# Patient Record
Sex: Male | Born: 1985 | Race: White | Hispanic: No | Marital: Single
Health system: Southern US, Community
[De-identification: ages and names within clinical notes are randomized; demographics above are authoritative.]

---
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
## 2008-10-18 ENCOUNTER — Ambulatory Visit: Payer: Self-pay | Admitting: Gastroenterology

## 2008-11-22 ENCOUNTER — Ambulatory Visit: Payer: Self-pay | Admitting: Unknown Physician Specialty

## 2008-11-28 ENCOUNTER — Ambulatory Visit: Payer: Self-pay | Admitting: Unknown Physician Specialty

## 2010-08-27 IMAGING — US ABDOMEN ULTRASOUND
1 series · 17 of 25 positions shown · non-contrast
Comparison: none

REASON FOR EXAM: Abd Pain Generalized Eval Stones
COMMENTS:

[Series 1: abdomen ultrasound · 17 of 50 slices shown]
[im 1/50]
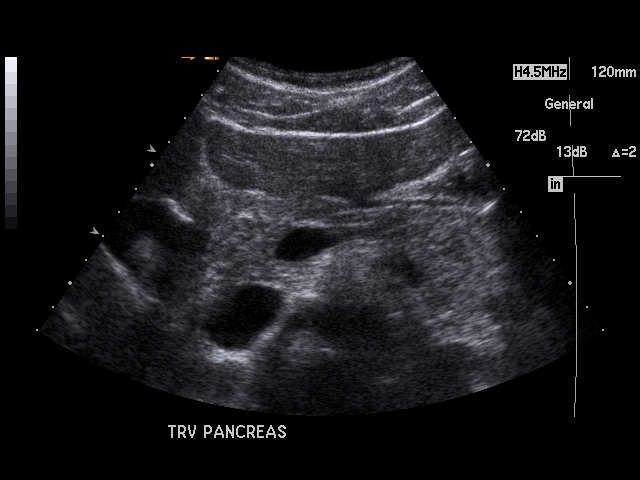
[im 5/50]
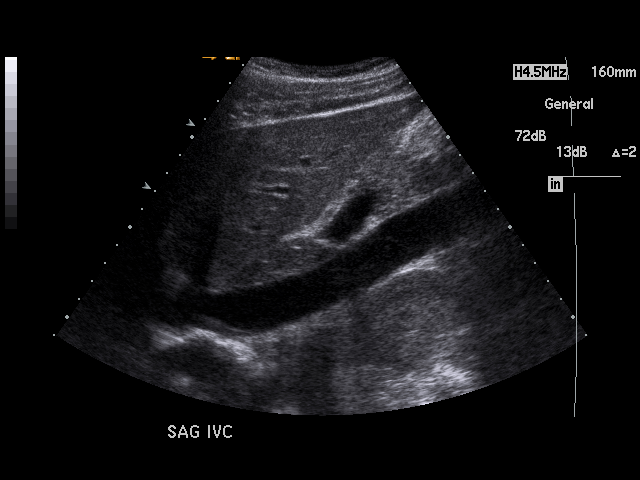
[im 7/50]
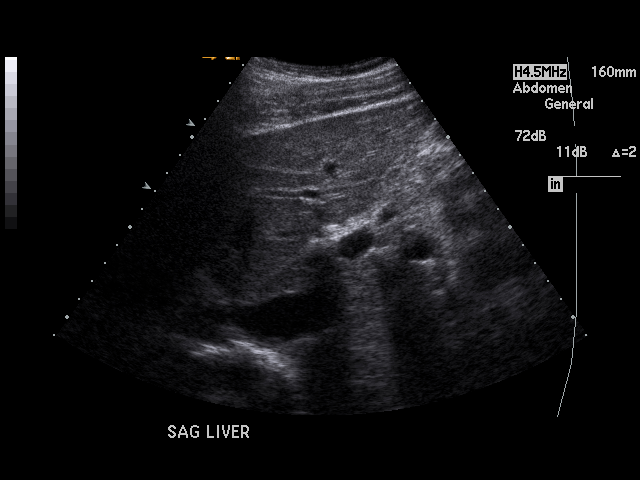
[im 11/50]
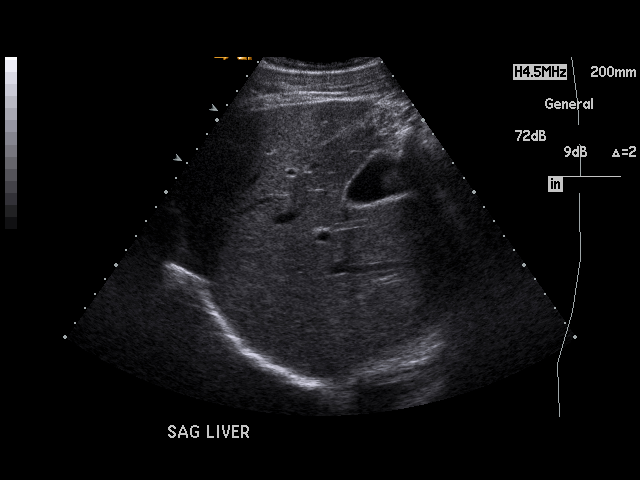
[im 13/50]
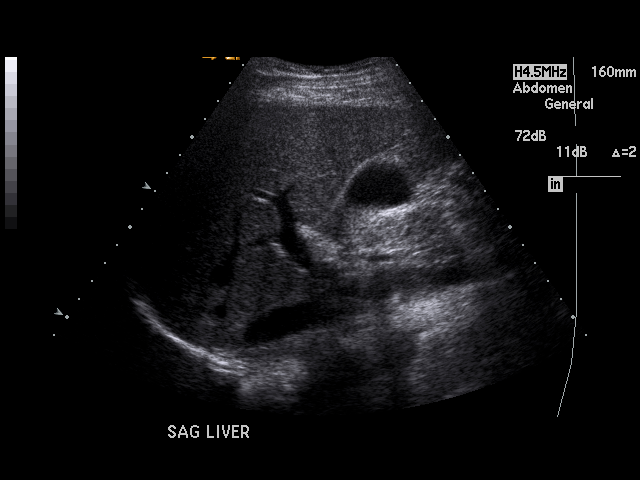
[im 17/50]
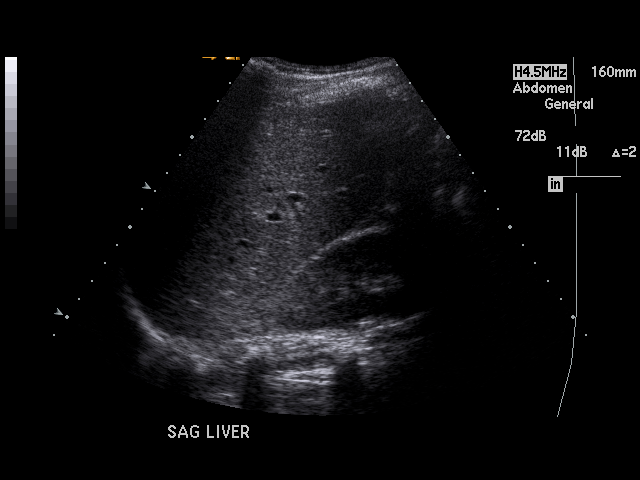
[im 19/50]
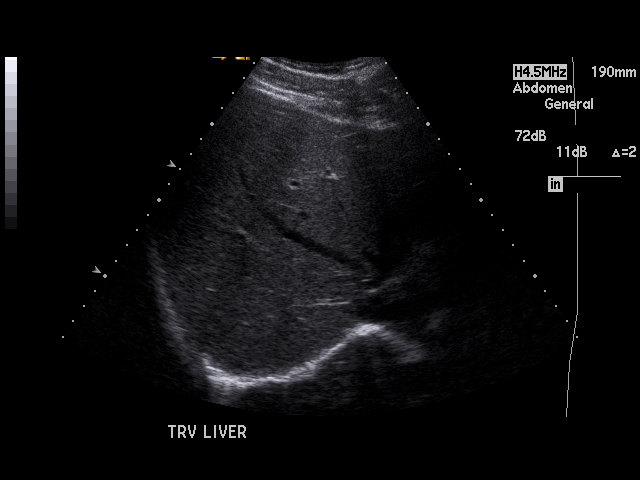
[im 23/50]
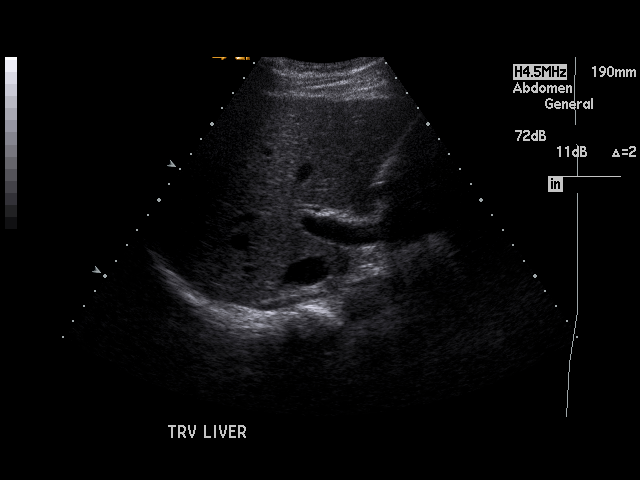
[im 25/50]
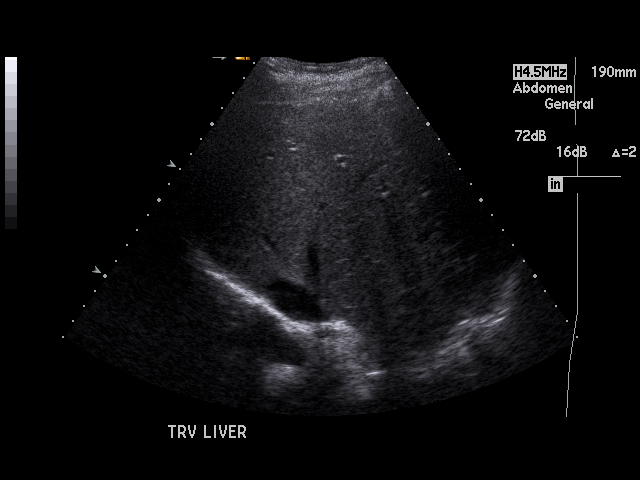
[im 27/50]
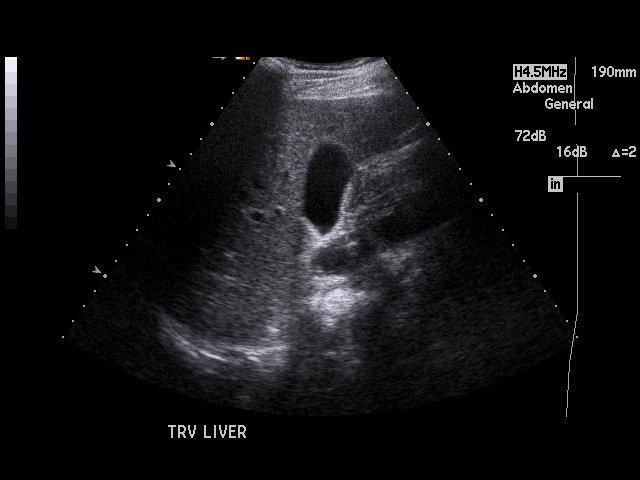
[im 31/50]
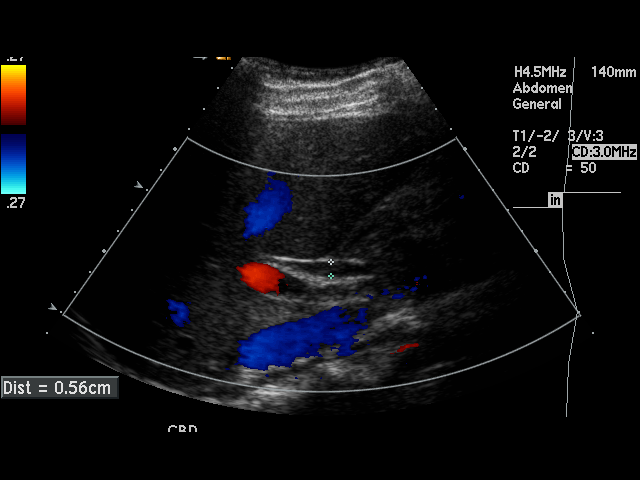
[im 33/50]
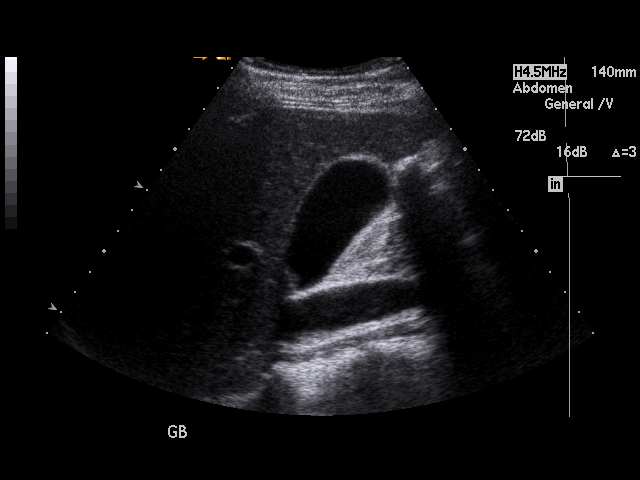
[im 37/50]
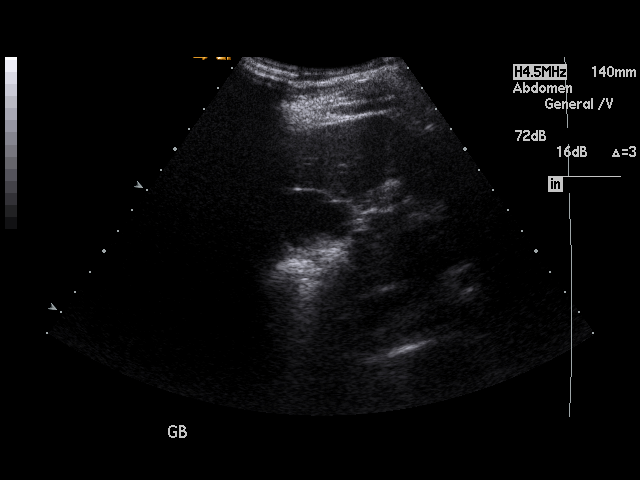
[im 39/50]
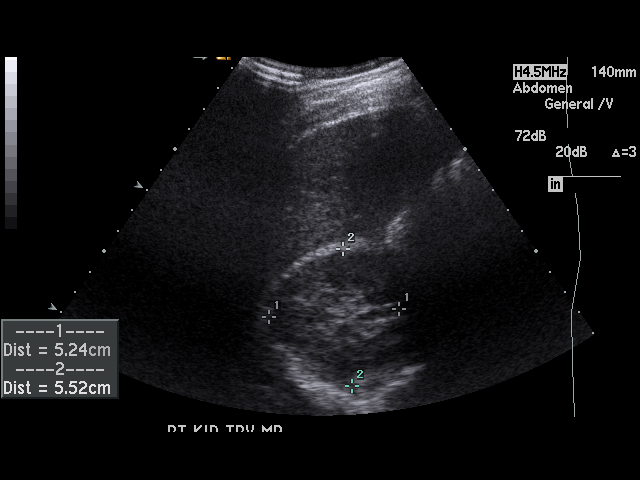
[im 43/50]
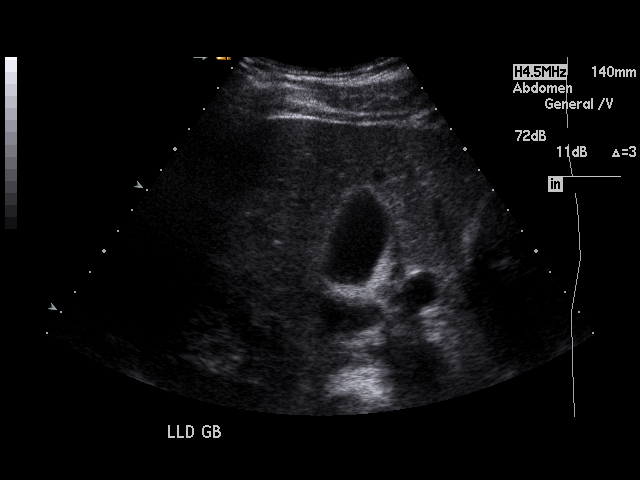
[im 45/50]
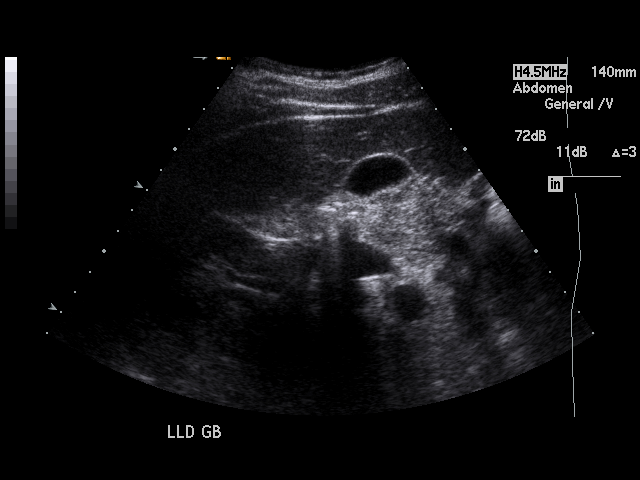
[im 50/50]
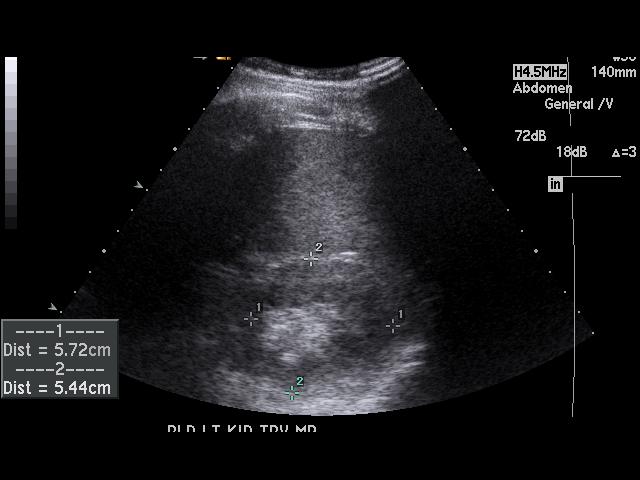

[17 of 25 positions shown; findings below may reference images not displayed]

PROCEDURE:     US  - US ABDOMEN GENERAL SURVEY  - November 22, 2008  [DATE]

RESULT:     Sonographic evaluation of the abdomen demonstrates the
visualized pancreas, liver, gallbladder, kidneys and aorta appear to be
unremarkable. The spleen appears enlarged measuring up to 14.26 cm in
length. The common bile duct diameter is also prominent at 5.6 mm. No
gallstones are evident. There is no sonographic Murphy's sign. There is no
ascites. The portal venous flow is unremarkable.
IMPRESSION: 1.  Common bile duct dilation. No definite gallstones are evident. Correlate
with LFT's.
2.  Incomplete visualization of the liver.
3.  Splenomegaly.

## 2010-08-27 IMAGING — NM NUCLEAR MEDICINE HEPATOHBILIARY INCLUDE GB
3 series · 20 of 20 positions shown · non-contrast
Comparison: none

REASON FOR EXAM: Abdominal Pain Generalized Eval Stones
COMMENTS:

[Series 1000: gallbladder dynamic (results) · 4.80mm/px · 6 of 60 frames shown]
[frame 6/60]
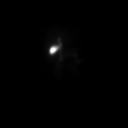
[frame 16/60]
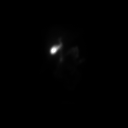
[frame 26/60]
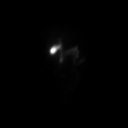
[frame 36/60]
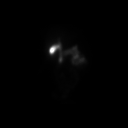
[frame 46/60]
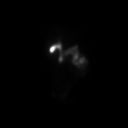
[frame 56/60]
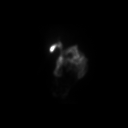

[Series 1000: gallbladder statics · 4.80mm/px · 8 of 8 slices shown]
[im 1/8]
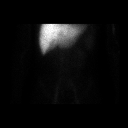
[im 2/8]
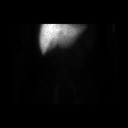
[im 3/8]
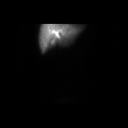
[im 4/8]
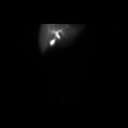
[im 5/8]
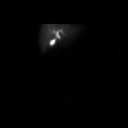
[im 6/8]
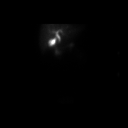
[im 7/8]
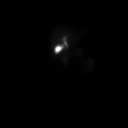
[im 8/8]
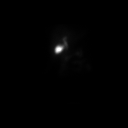

[Series 1000: gallbladder dynamic · 4.80mm/px · 6 of 60 frames shown]
[frame 6/60]
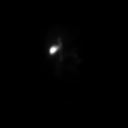
[frame 16/60]
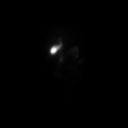
[frame 26/60]
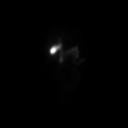
[frame 36/60]
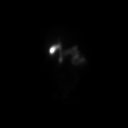
[frame 46/60]
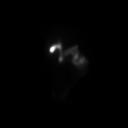
[frame 56/60]
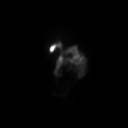

[20 of 20 positions shown; findings below may reference images not displayed]

PROCEDURE:     NM  - NM HEPATO WITH GB EJECT FRACTION  - November 22, 2008  [DATE]

RESULT:     The patient received an injection of 8.18 mCi of technetium 99m
Choletec. There is extraction of tracer from the blood pool by the liver
with activity first seen in the gallbladder and common bile duct by 10
minutes. There is small bowel localization by 30 minutes. There is
progressive clearing of activity from the liver parenchyma with accumulation
in the gallbladder. A continuous infusion of sincalide was initiated with a
total dose of 1.6 mcg administered over 30 minutes. The gallbladder ejection
fraction is calculated at 72%.
IMPRESSION: Normal appearing hepatobiliary scan and gallbladder
ejection fraction.

## 2018-02-12 ENCOUNTER — Emergency Department (HOSPITAL_COMMUNITY): Payer: Worker's Compensation

## 2018-02-12 ENCOUNTER — Inpatient Hospital Stay (HOSPITAL_COMMUNITY)
Admission: EM | Admit: 2018-02-12 | Discharge: 2018-02-17 | DRG: 087 | Disposition: A | Discharge status: Home Health | Payer: Worker's Compensation | Attending: Neurological Surgery | Admitting: Neurological Surgery

## 2018-02-12 DIAGNOSIS — S020XXA Fracture of vault of skull, initial encounter for closed fracture: Secondary | ICD-10-CM | POA: Diagnosis present

## 2018-02-12 DIAGNOSIS — S0101XA Laceration without foreign body of scalp, initial encounter: Secondary | ICD-10-CM | POA: Diagnosis present

## 2018-02-12 DIAGNOSIS — Y9289 Other specified places as the place of occurrence of the external cause: Secondary | ICD-10-CM

## 2018-02-12 DIAGNOSIS — S0990XA Unspecified injury of head, initial encounter: Secondary | ICD-10-CM

## 2018-02-12 DIAGNOSIS — N3289 Other specified disorders of bladder: Secondary | ICD-10-CM | POA: Diagnosis present

## 2018-02-12 DIAGNOSIS — R400 Somnolence: Secondary | ICD-10-CM | POA: Diagnosis present

## 2018-02-12 DIAGNOSIS — G9389 Other specified disorders of brain: Secondary | ICD-10-CM | POA: Diagnosis present

## 2018-02-12 DIAGNOSIS — S065X9A Traumatic subdural hemorrhage with loss of consciousness of unspecified duration, initial encounter: Secondary | ICD-10-CM | POA: Diagnosis present

## 2018-02-12 DIAGNOSIS — R339 Retention of urine, unspecified: Secondary | ICD-10-CM | POA: Diagnosis present

## 2018-02-12 DIAGNOSIS — R001 Bradycardia, unspecified: Secondary | ICD-10-CM | POA: Diagnosis present

## 2018-02-12 DIAGNOSIS — S065X0A Traumatic subdural hemorrhage without loss of consciousness, initial encounter: Principal | ICD-10-CM | POA: Diagnosis present

## 2018-02-12 DIAGNOSIS — S065XAA Traumatic subdural hemorrhage with loss of consciousness status unknown, initial encounter: Secondary | ICD-10-CM

## 2018-02-12 DIAGNOSIS — S50312A Abrasion of left elbow, initial encounter: Secondary | ICD-10-CM | POA: Diagnosis present

## 2018-02-12 DIAGNOSIS — S069X1A Unspecified intracranial injury with loss of consciousness of 30 minutes or less, initial encounter: Secondary | ICD-10-CM

## 2018-02-12 DIAGNOSIS — Z23 Encounter for immunization: Secondary | ICD-10-CM

## 2018-02-12 DIAGNOSIS — S069XAA Unspecified intracranial injury with loss of consciousness status unknown, initial encounter: Secondary | ICD-10-CM | POA: Diagnosis present

## 2018-02-12 DIAGNOSIS — S0219XA Other fracture of base of skull, initial encounter for closed fracture: Secondary | ICD-10-CM | POA: Diagnosis present

## 2018-02-12 DIAGNOSIS — S069X9A Unspecified intracranial injury with loss of consciousness of unspecified duration, initial encounter: Secondary | ICD-10-CM | POA: Diagnosis present

## 2018-02-12 LAB — COMPREHENSIVE METABOLIC PANEL
ALK PHOS: 60 U/L (ref 38–126)
ALT: 38 U/L (ref 0–44)
AST: 33 U/L (ref 15–41)
Albumin: 4.2 g/dL (ref 3.5–5.0)
Anion gap: 10 (ref 5–15)
BUN: 9 mg/dL (ref 6–20)
CALCIUM: 8.9 mg/dL (ref 8.9–10.3)
CHLORIDE: 107 mmol/L (ref 98–111)
CO2: 23 mmol/L (ref 22–32)
CREATININE: 0.93 mg/dL (ref 0.61–1.24)
Glucose, Bld: 140 mg/dL — ABNORMAL HIGH (ref 70–99)
Potassium: 3 mmol/L — ABNORMAL LOW (ref 3.5–5.1)
Sodium: 140 mmol/L (ref 135–145)
Total Bilirubin: 0.7 mg/dL (ref 0.3–1.2)
Total Protein: 6.9 g/dL (ref 6.5–8.1)

## 2018-02-12 LAB — CBC
HEMATOCRIT: 46.8 % (ref 39.0–52.0)
Hemoglobin: 15.5 g/dL (ref 13.0–17.0)
MCH: 28.9 pg (ref 26.0–34.0)
MCHC: 33.1 g/dL (ref 30.0–36.0)
MCV: 87.3 fL (ref 80.0–100.0)
NRBC: 0 % (ref 0.0–0.2)
PLATELETS: 302 10*3/uL (ref 150–400)
RBC: 5.36 MIL/uL (ref 4.22–5.81)
RDW: 12.2 % (ref 11.5–15.5)
WBC: 14.4 10*3/uL — AB (ref 4.0–10.5)

## 2018-02-12 LAB — SAMPLE TO BLOOD BANK

## 2018-02-12 LAB — URINALYSIS, ROUTINE W REFLEX MICROSCOPIC
BILIRUBIN URINE: NEGATIVE
Glucose, UA: NEGATIVE mg/dL
HGB URINE DIPSTICK: NEGATIVE
KETONES UR: NEGATIVE mg/dL
Leukocytes, UA: NEGATIVE
NITRITE: NEGATIVE
PH: 5 (ref 5.0–8.0)
Protein, ur: NEGATIVE mg/dL
SPECIFIC GRAVITY, URINE: 1.039 — AB (ref 1.005–1.030)

## 2018-02-12 LAB — I-STAT CHEM 8, ED
BUN: 11 mg/dL (ref 6–20)
CHLORIDE: 104 mmol/L (ref 98–111)
CREATININE: 0.9 mg/dL (ref 0.61–1.24)
Calcium, Ion: 1.13 mmol/L — ABNORMAL LOW (ref 1.15–1.40)
GLUCOSE: 139 mg/dL — AB (ref 70–99)
HEMATOCRIT: 47 % (ref 39.0–52.0)
Hemoglobin: 16 g/dL (ref 13.0–17.0)
POTASSIUM: 2.9 mmol/L — AB (ref 3.5–5.1)
Sodium: 142 mmol/L (ref 135–145)
TCO2: 26 mmol/L (ref 22–32)

## 2018-02-12 LAB — PROTIME-INR
INR: 1.06
Prothrombin Time: 13.7 seconds (ref 11.4–15.2)

## 2018-02-12 LAB — ETHANOL: Alcohol, Ethyl (B): <10 mg/dL (ref <10)

## 2018-02-12 LAB — CDS SEROLOGY

## 2018-02-12 LAB — I-STAT CG4 LACTIC ACID, ED: Lactic Acid, Venous: 2.34 mmol/L (ref 0.5–1.9)

## 2018-02-12 LAB — LACTIC ACID, PLASMA: Lactic Acid, Venous: 2.3 mmol/L (ref 0.5–1.9)

## 2018-02-12 MED ORDER — ONDANSETRON HCL 4 MG/2ML IJ SOLN
4.0000 mg | Freq: Once | INTRAMUSCULAR | Status: AC
  Administered 2018-02-12: 4 mg via INTRAVENOUS
  Filled 2018-02-12: qty 2

## 2018-02-12 MED ORDER — OXYCODONE HCL 5 MG PO TABS
10.0000 mg | ORAL_TABLET | ORAL | Status: DC | PRN
  Administered 2018-02-12 – 2018-02-15 (×11): 10 mg via ORAL
  Filled 2018-02-12 (×11): qty 2

## 2018-02-12 MED ORDER — FENTANYL CITRATE (PF) 100 MCG/2ML IJ SOLN
50.0000 ug | Freq: Once | INTRAMUSCULAR | Status: AC
  Administered 2018-02-12: 50 ug via INTRAVENOUS
  Filled 2018-02-12: qty 2

## 2018-02-12 MED ORDER — TETANUS-DIPHTH-ACELL PERTUSSIS 5-2.5-18.5 LF-MCG/0.5 IM SUSP
0.5000 mL | Freq: Once | INTRAMUSCULAR | Status: AC
  Administered 2018-02-12: 0.5 mL via INTRAMUSCULAR
  Filled 2018-02-12: qty 0.5

## 2018-02-12 MED ORDER — HYDROMORPHONE HCL 1 MG/ML IJ SOLN
0.5000 mg | INTRAMUSCULAR | Status: DC | PRN
  Administered 2018-02-12 – 2018-02-16 (×13): 0.5 mg via INTRAVENOUS
  Filled 2018-02-12 (×13): qty 1

## 2018-02-12 MED ORDER — IOPAMIDOL (ISOVUE-300) INJECTION 61%
100.0000 mL | Freq: Once | INTRAVENOUS | Status: AC | PRN
  Administered 2018-02-12: 100 mL via INTRAVENOUS

## 2018-02-12 MED ORDER — ONDANSETRON HCL 4 MG/2ML IJ SOLN
INTRAMUSCULAR | Status: AC
  Filled 2018-02-12: qty 2

## 2018-02-12 NOTE — Progress Notes (Signed)
Per RN, pt's wife already notified.  Spoke w/ ED front desk and they will inform when she arrives.  Passing off to incoming chaplain w/ updates.  Margretta SidleAndrea M Linnet Bottari Chaplain resident, [phone]
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
## 2018-02-12 NOTE — ED Provider Notes (Signed)
..  Laceration Repair Date/Time: 02/12/2018 8:27 PM Performed by: Jeannie FendMurphy, Laura A, PA-C Authorized by: Jeannie FendMurphy, Laura A, PA-C   Consent:    Consent obtained:  Verbal   Consent given by:  Patient   Risks discussed:  Infection, need for additional repair, pain, poor cosmetic result and poor wound healing   Alternatives discussed:  No treatment and delayed treatment Universal protocol:    Procedure explained and questions answered to patient or proxy's satisfaction: yes     Relevant documents present and verified: yes     Test results available and properly labeled: yes     Imaging studies available: yes     Required blood products, implants, devices, and special equipment available: yes     Site/side marked: yes     Immediately prior to procedure, a time out was called: yes     Patient identity confirmed:  Verbally with patient Anesthesia (see MAR for exact dosages):    Anesthesia method:  None Laceration details:    Location:  Scalp   Scalp location:  L parietal   Length (cm):  3   Depth (mm):  3 Repair type:    Repair type:  Simple Pre-procedure details:    Preparation:  Patient was prepped and draped in usual sterile fashion and imaging obtained to evaluate for foreign bodies Exploration:    Wound exploration: entire depth of wound probed and visualized     Wound extent: no muscle damage noted     Contaminated: no   Treatment:    Area cleansed with:  Saline   Amount of cleaning:  Standard   Irrigation solution:  Sterile saline Skin repair:    Repair method:  Staples   Number of staples:  3 Approximation:    Approximation:  Close Post-procedure details:    Dressing:  Open (no dressing)   Patient tolerance of procedure:  Tolerated well, no immediate complications     Alden HippMurphy, Laura A, PA-C 02/12/18 2028    Vanetta MuldersZackowski, Scott, MD 02/16/18 [phone]

## 2018-02-13 LAB — LACTIC ACID, PLASMA
LACTIC ACID, VENOUS: 1.8 mmol/L (ref 0.5–1.9)
Lactic Acid, Venous: 1.5 mmol/L (ref 0.5–1.9)

## 2018-02-13 LAB — MRSA PCR SCREENING: MRSA BY PCR: NEGATIVE

## 2018-02-13 MED ORDER — LABETALOL HCL 5 MG/ML IV SOLN: 10.0000 mg | INTRAVENOUS | Status: DC | PRN

## 2018-02-13 MED ORDER — DOCUSATE SODIUM 100 MG PO CAPS
100.0000 mg | ORAL_CAPSULE | Freq: Two times a day (BID) | ORAL | Status: DC
  Administered 2018-02-13 – 2018-02-17 (×9): 100 mg via ORAL
  Filled 2018-02-13 (×9): qty 1

## 2018-02-13 MED ORDER — FAMOTIDINE 20 MG PO TABS
20.0000 mg | ORAL_TABLET | Freq: Every day | ORAL | Status: DC
  Administered 2018-02-13 – 2018-02-17 (×5): 20 mg via ORAL
  Filled 2018-02-13 (×5): qty 1

## 2018-02-13 MED ORDER — TAMSULOSIN HCL 0.4 MG PO CAPS
0.4000 mg | ORAL_CAPSULE | Freq: Every day | ORAL | Status: AC
  Administered 2018-02-13 – 2018-02-15 (×3): 0.4 mg via ORAL
  Filled 2018-02-13 (×3): qty 1

## 2018-02-13 MED ORDER — ONDANSETRON HCL 4 MG PO TABS
4.0000 mg | ORAL_TABLET | ORAL | Status: DC | PRN
  Administered 2018-02-16 – 2018-02-17 (×4): 4 mg via ORAL
  Filled 2018-02-13 (×4): qty 1

## 2018-02-13 MED ORDER — ACETAMINOPHEN 325 MG PO TABS
650.0000 mg | ORAL_TABLET | ORAL | Status: DC | PRN
  Administered 2018-02-13 – 2018-02-15 (×3): 650 mg via ORAL
  Filled 2018-02-13 (×3): qty 2

## 2018-02-13 MED ORDER — OXYCODONE HCL 5 MG PO TABS
5.0000 mg | ORAL_TABLET | ORAL | Status: DC | PRN
  Administered 2018-02-15: 5 mg via ORAL
  Filled 2018-02-13: qty 1

## 2018-02-13 MED ORDER — SODIUM CHLORIDE 0.9 % IV SOLN
INTRAVENOUS | Status: DC | PRN
  Administered 2018-02-13: 1000 mL via INTRAVENOUS

## 2018-02-13 MED ORDER — ONDANSETRON HCL 4 MG/2ML IJ SOLN
4.0000 mg | INTRAMUSCULAR | Status: DC | PRN
  Administered 2018-02-13 – 2018-02-16 (×7): 4 mg via INTRAVENOUS
  Filled 2018-02-13 (×7): qty 2

## 2018-02-13 MED ORDER — POLYETHYLENE GLYCOL 3350 17 G PO PACK: 17.0000 g | PACK | Freq: Every day | ORAL | Status: DC | PRN

## 2018-02-13 MED ORDER — ACETAMINOPHEN 650 MG RE SUPP: 650.0000 mg | RECTAL | Status: DC | PRN

## 2018-02-13 MED ORDER — PROMETHAZINE HCL 25 MG PO TABS
12.5000 mg | ORAL_TABLET | ORAL | Status: DC | PRN
  Administered 2018-02-13 (×2): 25 mg via ORAL
  Administered 2018-02-14 – 2018-02-16 (×2): 12.5 mg via ORAL
  Filled 2018-02-13 (×4): qty 1

## 2018-02-13 MED ORDER — HEPARIN SODIUM (PORCINE) 5000 UNIT/ML IJ SOLN
5000.0000 [IU] | Freq: Three times a day (TID) | INTRAMUSCULAR | Status: DC
  Administered 2018-02-14 – 2018-02-16 (×8): 5000 [IU] via SUBCUTANEOUS
  Filled 2018-02-13 (×9): qty 1

## 2018-02-13 MED ORDER — HYDROMORPHONE HCL 1 MG/ML IJ SOLN: 0.3000 mg | INTRAMUSCULAR | Status: DC | PRN
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
## 2018-02-14 MED ORDER — PROCHLORPERAZINE 25 MG RE SUPP
25.0000 mg | Freq: Once | RECTAL | Status: AC
  Administered 2018-02-14: 25 mg via RECTAL
  Filled 2018-02-14: qty 1

## 2018-02-14 MED ORDER — OXYCODONE HCL 5 MG PO TABS: 5.0000 mg | ORAL_TABLET | ORAL | 0 refills | Status: DC | PRN

## 2018-02-14 MED ORDER — PROCHLORPERAZINE 25 MG RE SUPP
25.0000 mg | Freq: Two times a day (BID) | RECTAL | Status: DC | PRN
  Filled 2018-02-14: qty 1

## 2018-02-14 MED ORDER — DOCUSATE SODIUM 100 MG PO CAPS: 100.0000 mg | ORAL_CAPSULE | Freq: Two times a day (BID) | ORAL | 0 refills | Status: DC

## 2018-02-14 MED ORDER — SODIUM CHLORIDE 0.9 % IV BOLUS
500.0000 mL | Freq: Once | INTRAVENOUS | Status: AC
  Administered 2018-02-14: 500 mL via INTRAVENOUS

## 2018-02-14 NOTE — Progress Notes (Signed)
Pt unable to void despite bladder scan volume of . Pt refuses to be in/out cath, states he will try to self void again.

## 2018-02-14 NOTE — Progress Notes (Signed)
Pt continues to be nauseous and have limited intake, Dr Franky Machoabbell notified. Orders for 25mg  of rectal compazine and 500 ml NS bolus.
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
## 2018-02-14 NOTE — Discharge Instructions (Addendum)
Discharge Instructions  No restriction in activities, slowly increase your activity back to normal. Okay to shower on the day of discharge. Do not perform any activities that put you at risk for a second head injury. Two severe head injuries in close succession can be very severe and sometimes fatal, which is called second impact syndrome.  Call your primary care physician to schedule a follow up appointment for 1-2 weeks from now to discuss your injury and hospitalization.  Call Dr. Marcy Sirenstergard's office at [phone] if you have any clear drainage from your ears or nose. This could be a sign of a cerebrospinal fluid leak, which can become serious if it is left untreated.    Concussion, Adult A concussion is a brain injury from a direct hit (blow) to the head or body. This injury causes the brain to shake quickly back and forth inside the skull. It is caused by:  A hit to the head.  A quick and sudden movement (jolt) of the head or neck.  How fast you will get better from a concussion depends on many things like how bad your concussion was, what part of your brain was hurt, how old you are, and how healthy you were before the concussion. Recovery can take time. It is important to wait to return to activity until a doctor says it is safe and your symptoms are all gone. Follow these instructions at home: Activity  Limit activities that need a lot of thought or concentration. These include: ? Homework or work for your job. ? Watching TV. ? Computer work. ? Playing memory games and puzzles.  Rest. Rest helps the brain to heal. Make sure you: ? Get plenty of sleep at night. Do not stay up late. ? Go to bed at the same time every day. ? Rest during the day. Take naps or rest breaks when you feel tired.  It can be dangerous if you get another concussion before the first one has healed Do not do activities that could cause a second concussion, such as riding a bike or playing sports.  Ask  your doctor when you can return to your normal activities, like driving, riding a bike, or using machinery. Your ability to react may be slower. Do not do these activities if you are dizzy. Your doctor will likely give you a plan for slowly going back to activities. General instructions  Take over-the-counter and prescription medicines only as told by your doctor.  Do not drink alcohol until your doctor says you can.  If it is harder than usual to remember things, write them down.  If you are easily distracted, try to do one thing at a time. For example, do not try to watch TV while making dinner.  Talk with family members or close friends when you need to make important decisions.  Watch your symptoms and tell other people to do the same. Other problems (complications) can happen after a concussion. Older adults with a brain injury may have a higher risk of serious problems, such as a blood clot in the brain.  Tell your teachers, school nurse, school counselor, coach, Event organiserathletic trainer, or work Production designer, theatre/television/filmmanager about your injury and symptoms. Tell them about what you can or cannot do. They should watch for: ? More problems with attention or concentration. ? More trouble remembering or learning new information. ? More time needed to do tasks or assignments. ? Being more annoyed (irritable) or having a harder time dealing with stress. ? Any  other symptoms that get worse.  Keep all follow-up visits as told by your health care provider. This is important. Prevention  It is very important that you donot get another brain injury, especially before you have healed. In rare cases, another injury can cause permanent brain damage, brain swelling, or death. You have the most risk if you get another head injury in the first 7-10 days after you were hurt before. To avoid injuries: ? Wear a seat belt when you ride in a car. ? Do not drink too much alcohol. ? Avoid activities that could make you get a second  concussion, like contact sports. ? Wear a helmet when you do activities like:  Biking.  Skiing.  Skateboarding.  Skating. ? Make your home safe by:  Removing things from the floor or stairs that could make you trip.  Using grab bars in bathrooms and handrails by stairs.  Placing non-slip mats on floors and in bathtubs.  Putting more light in dark areas. Contact a doctor if:  Your symptoms get worse.  You have new symptoms.  You keep having symptoms for more than 2 weeks. Get help right away if:  You have weakness in any part of your body.  You have loss of feeling (numbness).  You feel off balance.  You keep throwing up (vomiting).  You feel more sleepy.  The black center of one eye (pupil) is bigger than the other one.  You twitch or shake violently (convulse) or have a seizure.  Your speech is not clear (is slurred).  You pass out (lose consciousness). Summary  A concussion is a brain injury from a direct hit (blow) to the head or body.  This condition is treated with rest and careful watching of symptoms.  If you keep having symptoms for more than 2 weeks, call your doctor.
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
## 2018-02-15 LAB — RENAL FUNCTION PANEL
ALBUMIN: 3.6 g/dL (ref 3.5–5.0)
ANION GAP: 7 (ref 5–15)
BUN: 10 mg/dL (ref 6–20)
CO2: 27 mmol/L (ref 22–32)
Calcium: 8.5 mg/dL — ABNORMAL LOW (ref 8.9–10.3)
Chloride: 102 mmol/L (ref 98–111)
Creatinine, Ser: 0.92 mg/dL (ref 0.61–1.24)
GFR calc Af Amer: >60 mL/min (ref >60)
GFR calc non Af Amer: >60 mL/min (ref >60)
GLUCOSE: 136 mg/dL — AB (ref 70–99)
Phosphorus: 2.8 mg/dL (ref 2.5–4.6)
Potassium: 3.4 mmol/L — ABNORMAL LOW (ref 3.5–5.1)
Sodium: 136 mmol/L (ref 135–145)
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
## 2018-02-15 NOTE — Progress Notes (Addendum)
Neurosurgery Service Progress Note  Subjective: No acute events overnight, headache still significant, vomited yesterday while working with OT but no emesis since then  Objective: Vitals:   02/15/18 0423 02/15/18 0800 02/15/18 0900 02/15/18 1205  BP: 117/79 122/77 118/65 120/87  Pulse: 61 (!) 54  (!) 45  Resp: (!) 22 17 20 19   Temp: 98.3 F (36.8 C)  98.7 F (37.1 C) 98.4 F (36.9 C)  TempSrc: Oral  Oral Oral  SpO2: 93% 96% 94% 98%  Weight:      Height:       Temp (24hrs), Avg:98.7 F (37.1 C), Min:98.3 F (36.8 C), Max:99.1 F (37.3 C)  CBC Latest Ref Rng & Units 02/12/2018 02/12/2018  WBC 4.0 - 10.5 K/uL - 14.4(H)  Hemoglobin 13.0 - 17.0 g/dL 41.316.0 24.415.5  Hematocrit 01.039.0 - 52.0 % 47.0 46.8  Platelets 150 - 400 K/uL - 302   BMP Latest Ref Rng & Units 02/12/2018 02/12/2018  Glucose 70 - 99 mg/dL 272(Z139(H) 366(Y140(H)  BUN 6 - 20 mg/dL 11 9  Creatinine 4.030.61 - 1.24 mg/dL 4.740.90 2.590.93  Sodium 563135 - 145 mmol/L 142 140  Potassium 3.5 - 5.1 mmol/L 2.9(L) 3.0(L)  Chloride 98 - 111 mmol/L 104 107  CO2 22 - 32 mmol/L - 23  Calcium 8.9 - 10.3 mg/dL - 8.9    Intake/Output Summary (Last 24 hours) at 02/15/2018 1340 Last data filed at 02/15/2018 0855 Gross per 24 hour  Intake 1043.53 ml  Output -  Net 1043.53 ml    Current Facility-Administered Medications:  .  0.9 %  sodium chloride infusion, , Intravenous, PRN, Jadene Pierinistergard,  A, MD, Stopped at 02/13/18 [phone] .  acetaminophen (TYLENOL) tablet 650 mg, 650 mg, Oral, Q4H PRN, 650 mg at 02/15/18 1254 **OR** acetaminophen (TYLENOL) suppository 650 mg, 650 mg, Rectal, Q4H PRN, ,  A, MD .  docusate sodium (COLACE) capsule 100 mg, 100 mg, Oral, BID, ,  A, MD, 100 mg at 02/15/18 [id number] .  famotidine (PEPCID) tablet 20 mg, 20 mg, Oral, Daily, ,  A, MD, 20 mg at 02/15/18 0925 .  heparin injection 5,000 Units, 5,000 Units, Subcutaneous, Q8H, Jadene Pierinistergard,  A, MD, 5,000 Units at 02/15/18 0518 .   HYDROmorphone (DILAUDID) injection 0.5 mg, 0.5 mg, Intravenous, Q3H PRN, Jadene Pierinistergard,  A, MD, 0.5 mg at 02/15/18 1254 .  labetalol (NORMODYNE,TRANDATE) injection 10-40 mg, 10-40 mg, Intravenous, Q10 min PRN, ,  A, MD .  ondansetron (ZOFRAN) tablet 4 mg, 4 mg, Oral, Q4H PRN **OR** ondansetron (ZOFRAN) injection 4 mg, 4 mg, Intravenous, Q4H PRN, Jadene Pierinistergard,  A, MD, 4 mg at 02/14/18 1349 .  oxyCODONE (Oxy IR/ROXICODONE) immediate release tablet 10 mg, 10 mg, Oral, Q4H PRN, Jadene Pierinistergard,  A, MD, 10 mg at 02/15/18 0431 .  oxyCODONE (Oxy IR/ROXICODONE) immediate release tablet 5 mg, 5 mg, Oral, Q4H PRN, Jadene Pierinistergard,  A, MD, 5 mg at 02/15/18 0925 .  polyethylene glycol (MIRALAX / GLYCOLAX) packet 17 g, 17 g, Oral, Daily PRN, Jadene Pierinistergard,  A, MD .  promethazine (PHENERGAN) tablet 12.5-25 mg, 12.5-25 mg, Oral, Q4H PRN, Jadene Pierinistergard,  A, MD, 12.5 mg at 02/14/18 0856   Physical Exam: AOx3, PERRL, EOMI, FS, Strength 5/5 x4, SILTx4  Assessment & Plan: 32 [facility]. [facility], contusion, SDH. -urinary retention, pt currently refusing intermittent cath or foley, okay to cath whenever pt consents -bradycardia but BP WNL, has been bradycardic for the entire admission, likely worsened by bladder distention -poor po intake, hypokalemia on last  RFP - repeat RFP, encouraged po intake -if any further emesis, will repeat CTH   A   02/15/18 1:40 PM

## 2018-02-15 NOTE — Progress Notes (Signed)
Call placed to neurosurgery call center for on-call MD. Reporting pt emesis event and assess need for Head CT. Dr. Wyn[facility]ram [facility]

## 2018-02-15 NOTE — Progress Notes (Signed)
PT Cancellation Note  Patient Details Name: Scott Ryan MRN: [id number] DOB: 31-May-1985   Cancelled Treatment:    Reason Eval/Treat Not Completed: Other (comment)(Pt with 10/10 HA after dilaudid, HR 33-46 while in room speaking with pt and wife, nauseated and currently not agreeable to therapy. Pt reports still unable to void and agreeable to in/out cath with RN made aware.    Scott Ryan 02/15/2018, 1:31 PM [name] Scott Ryan, PT Acute Rehabilitation Services Pager: [phone] Office: [phone]

## 2018-02-15 NOTE — Progress Notes (Signed)
Pt reported emesis event. Pt refused full neuro assessment. Pt refused pupillary assessment, stated it makes his head hurt.

## 2018-02-16 ENCOUNTER — Inpatient Hospital Stay (HOSPITAL_COMMUNITY): Payer: Worker's Compensation

## 2018-02-16 LAB — BASIC METABOLIC PANEL
ANION GAP: 8 (ref 5–15)
BUN: 11 mg/dL (ref 6–20)
CALCIUM: 8.6 mg/dL — AB (ref 8.9–10.3)
CO2: 26 mmol/L (ref 22–32)
Chloride: 100 mmol/L (ref 98–111)
Creatinine, Ser: 0.82 mg/dL (ref 0.61–1.24)
GLUCOSE: 143 mg/dL — AB (ref 70–99)
POTASSIUM: 3.9 mmol/L (ref 3.5–5.1)
SODIUM: 134 mmol/L — AB (ref 135–145)

## 2018-02-16 LAB — CBC
HCT: 40.1 % (ref 39.0–52.0)
Hemoglobin: 13.3 g/dL (ref 13.0–17.0)
MCH: 28.7 pg (ref 26.0–34.0)
MCHC: 33.2 g/dL (ref 30.0–36.0)
MCV: 86.4 fL (ref 80.0–100.0)
PLATELETS: 222 10*3/uL (ref 150–400)
RBC: 4.64 MIL/uL (ref 4.22–5.81)
RDW: 12 % (ref 11.5–15.5)
WBC: 10.6 10*3/uL — ABNORMAL HIGH (ref 4.0–10.5)
nRBC: 0 % (ref 0.0–0.2)

## 2018-02-16 LAB — MAGNESIUM: MAGNESIUM: 1.9 mg/dL (ref 1.7–2.4)

## 2018-02-16 MED ORDER — HYDROCODONE-ACETAMINOPHEN 5-325 MG PO TABS
1.0000 | ORAL_TABLET | ORAL | Status: DC | PRN
  Administered 2018-02-16: 2 via ORAL
  Administered 2018-02-16: 1 via ORAL
  Administered 2018-02-16 – 2018-02-17 (×4): 2 via ORAL
  Filled 2018-02-16: qty 1
  Filled 2018-02-16 (×5): qty 2

## 2018-02-16 MED ORDER — DEXAMETHASONE SODIUM PHOSPHATE 4 MG/ML IJ SOLN
4.0000 mg | Freq: Four times a day (QID) | INTRAMUSCULAR | Status: DC
  Administered 2018-02-16 (×2): 4 mg via INTRAVENOUS
  Filled 2018-02-16: qty 1

## 2018-02-16 MED ORDER — SODIUM CHLORIDE 0.9 % IV SOLN
INTRAVENOUS | Status: DC
  Administered 2018-02-16 (×2): via INTRAVENOUS
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
## 2018-02-16 NOTE — Plan of Care (Signed)
  Problem: Education: Goal: Knowledge of General Education information will improve Description: Including pain rating scale, medication(s)/side effects and non-pharmacologic comfort measures Outcome: Progressing   Problem: Health Behavior/Discharge Planning: Goal: Ability to manage health-related needs will improve Outcome: Progressing   Problem: Clinical Measurements: Goal: Ability to maintain clinical measurements within normal limits will improve Outcome: Progressing Goal: Will remain free from infection Outcome: Progressing Goal: Diagnostic test results will improve Outcome: Progressing Goal: Respiratory complications will improve Outcome: Progressing Goal: Cardiovascular complication will be avoided Outcome: Progressing   Problem: Activity: Goal: Risk for activity intolerance will decrease Outcome: Progressing   Problem: Nutrition: Goal: Adequate nutrition will be maintained Outcome: Progressing   Problem: Coping: Goal: Level of anxiety will decrease Outcome: Progressing   Problem: Elimination: Goal: Will not experience complications related to bowel motility Outcome: Progressing Goal: Will not experience complications related to urinary retention Outcome: Progressing   Problem: Pain Managment: Goal: General experience of comfort will improve Outcome: Progressing   Problem: Safety: Goal: Ability to remain free from injury will improve Outcome: Progressing   Problem: Skin Integrity: Goal: Risk for impaired skin integrity will decrease Outcome: Progressing   Problem: Education: Goal: Knowledge of the prescribed therapeutic regimen Outcome: Progressing Goal: Knowledge of disease or condition will improve Outcome: Progressing   Problem: Clinical Measurements: Goal: Neurologic status will improve Outcome: Progressing   Problem: Tissue Perfusion: Goal: Ability to maintain intracranial pressure will improve Outcome: Progressing   Problem:  Respiratory: Goal: Will regain and/or maintain adequate ventilation Outcome: Progressing   Problem: Skin Integrity: Goal: Risk for impaired skin integrity will decrease Outcome: Progressing Goal: Demonstration of wound healing without infection will improve Outcome: Progressing   Problem: Psychosocial: Goal: Ability to verbalize positive feelings about self will improve Outcome: Progressing Goal: Ability to participate in self-care as condition permits will improve Outcome: Progressing Goal: Ability to identify appropriate support needs will improve Outcome: Progressing   Problem: Health Behavior/Discharge Planning: Goal: Ability to manage health-related needs will improve Outcome: Progressing   Problem: Nutritional: Goal: Risk of aspiration will decrease Outcome: Progressing Goal: Dietary intake will improve Outcome: Progressing   Problem: Communication: Goal: Ability to communicate needs accurately will improve Outcome: Progressing   

## 2018-02-16 NOTE — Progress Notes (Signed)
Neurosurgery Service Progress Note  Subjective: No acute events overnight, emesis x1, CTH repeated with expected findings, still not tolerating much po input  Objective: Vitals:   02/15/18 2028 02/16/18 0002 02/16/18 0215 02/16/18 0400  BP: 109/73 (!) 102/53 135/70 124/75  Pulse:   80 (!) 53  Resp:   16 17  Temp: 98.7 F (37.1 C) 99.2 F (37.3 C)  98.7 F (37.1 C)  TempSrc: Oral Oral  Oral  SpO2:   96% 98%  Weight:      Height:       Temp (24hrs), Avg:98.7 F (37.1 C), Min:98.2 F (36.8 C), Max:99.2 F (37.3 C)  CBC Latest Ref Rng & Units 02/16/2018 02/12/2018 02/12/2018  WBC 4.0 - 10.5 K/uL 10.6(H) - 14.4(H)  Hemoglobin 13.0 - 17.0 g/dL 29.513.3 62.116.0 30.815.5  Hematocrit 39.0 - 52.0 % 40.1 47.0 46.8  Platelets 150 - 400 K/uL 222 - 302   BMP Latest Ref Rng & Units 02/16/2018 02/15/2018 02/12/2018  Glucose 70 - 99 mg/dL 657(Q143(H) 469(G136(H) 295(M139(H)  BUN 6 - 20 mg/dL 11 10 11   Creatinine 0.61 - 1.24 mg/dL 8.410.82 3.240.92 4.010.90  Sodium 135 - 145 mmol/L 134(L) 136 142  Potassium 3.5 - 5.1 mmol/L 3.9 3.4(L) 2.9(L)  Chloride 98 - 111 mmol/L 100 102 104  CO2 22 - 32 mmol/L 26 27 -  Calcium 8.9 - 10.3 mg/dL 0.2(V8.6(L) 2.5(D8.5(L) -    Intake/Output Summary (Last 24 hours) at 02/16/2018 0742 Last data filed at 02/15/2018 1800 Gross per 24 hour  Intake 240 ml  Output 750 ml  Net -510 ml    Current Facility-Administered Medications:  .  0.9 %  sodium chloride infusion, , Intravenous, PRN, Jadene Pierinistergard, Larysa Pall A, MD, Stopped at 02/13/18 [phone] .  acetaminophen (TYLENOL) tablet 650 mg, 650 mg, Oral, Q4H PRN, 650 mg at 02/15/18 1254 **OR** acetaminophen (TYLENOL) suppository 650 mg, 650 mg, Rectal, Q4H PRN, Matheu Ploeger A, MD .  dexamethasone (DECADRON) injection 4 mg, 4 mg, Intravenous, Q6H, Donalee Citrinram, Gary, MD, 4 mg at 02/16/18 [id number] .  docusate sodium (COLACE) capsule 100 mg, 100 mg, Oral, BID, Jadene Pierinistergard, Veron Senner A, MD, 100 mg at 02/15/18 2206 .  famotidine (PEPCID) tablet 20 mg, 20 mg, Oral, Daily, Yazmyne Sara,  Bernetha Anschutz A, MD, 20 mg at 02/15/18 0925 .  heparin injection 5,000 Units, 5,000 Units, Subcutaneous, Q8H, Jadene Pierinistergard, Jaxten Brosh A, MD, 5,000 Units at 02/15/18 2206 .  HYDROmorphone (DILAUDID) injection 0.5 mg, 0.5 mg, Intravenous, Q3H PRN, Jadene Pierinistergard, Radin Raptis A, MD, 0.5 mg at 02/16/18 0635 .  labetalol (NORMODYNE,TRANDATE) injection 10-40 mg, 10-40 mg, Intravenous, Q10 min PRN, Chimamanda Siegfried A, MD .  ondansetron (ZOFRAN) tablet 4 mg, 4 mg, Oral, Q4H PRN **OR** ondansetron (ZOFRAN) injection 4 mg, 4 mg, Intravenous, Q4H PRN, Jadene Pierinistergard, Terrah Decoster A, MD, 4 mg at 02/16/18 [id number] .  oxyCODONE (Oxy IR/ROXICODONE) immediate release tablet 10 mg, 10 mg, Oral, Q4H PRN, Jadene Pierinistergard, Hien Cunliffe A, MD, 10 mg at 02/15/18 2205 .  oxyCODONE (Oxy IR/ROXICODONE) immediate release tablet 5 mg, 5 mg, Oral, Q4H PRN, Jadene Pierinistergard, Azai Gaffin A, MD, 5 mg at 02/15/18 0925 .  polyethylene glycol (MIRALAX / GLYCOLAX) packet 17 g, 17 g, Oral, Daily PRN, Rylan Kaufmann A, MD .  promethazine (PHENERGAN) tablet 12.5-25 mg, 12.5-25 mg, Oral, Q4H PRN, Jadene Pierinistergard, Maegen Wigle A, MD, 12.5 mg at 02/16/18 0226   Physical Exam: AOx3, PERRL, EOMI, FS, Strength 5/5 x4, SILTx4  Assessment & Plan: 32 [facility], [facility], [facility]H. 11/18 rpt CTH expected evolution,  no significant changes -change oxycodone to hydrocodone to try to decrease nausea -restart IVF for poor fluid intake -SCDs/TEDs/SQH  Jadene Pierini  02/16/18 7:42 AM

## 2018-02-17 MED ORDER — ONDANSETRON 4 MG PO TBDP: 4.0000 mg | ORAL_TABLET | Freq: Three times a day (TID) | ORAL | 2 refills | Status: AC | PRN

## 2018-02-17 MED ORDER — DOCUSATE SODIUM 100 MG PO CAPS: 100.0000 mg | ORAL_CAPSULE | Freq: Two times a day (BID) | ORAL | 0 refills | Status: DC

## 2018-02-17 MED ORDER — HYDROCODONE-ACETAMINOPHEN 5-325 MG PO TABS: 1.0000 | ORAL_TABLET | ORAL | 0 refills | Status: AC | PRN

## 2018-02-17 MED ORDER — OXYCODONE HCL 5 MG PO TABS: 5.0000 mg | ORAL_TABLET | ORAL | 0 refills | Status: DC | PRN

## 2018-02-17 MED ORDER — DOCUSATE SODIUM 100 MG PO CAPS: 100.0000 mg | ORAL_CAPSULE | Freq: Two times a day (BID) | ORAL | 0 refills | Status: AC

## 2018-02-17 NOTE — Plan of Care (Signed)
  Problem: Education: Goal: Knowledge of General Education information will improve Description: Including pain rating scale, medication(s)/side effects and non-pharmacologic comfort measures Outcome: Progressing   Problem: Health Behavior/Discharge Planning: Goal: Ability to manage health-related needs will improve Outcome: Progressing   Problem: Clinical Measurements: Goal: Ability to maintain clinical measurements within normal limits will improve Outcome: Progressing Goal: Will remain free from infection Outcome: Progressing Goal: Diagnostic test results will improve Outcome: Progressing Goal: Respiratory complications will improve Outcome: Progressing Goal: Cardiovascular complication will be avoided Outcome: Progressing   Problem: Activity: Goal: Risk for activity intolerance will decrease Outcome: Progressing   Problem: Nutrition: Goal: Adequate nutrition will be maintained Outcome: Progressing   Problem: Coping: Goal: Level of anxiety will decrease Outcome: Progressing   Problem: Elimination: Goal: Will not experience complications related to bowel motility Outcome: Progressing Goal: Will not experience complications related to urinary retention Outcome: Progressing   Problem: Pain Managment: Goal: General experience of comfort will improve Outcome: Progressing   Problem: Safety: Goal: Ability to remain free from injury will improve Outcome: Progressing   Problem: Skin Integrity: Goal: Risk for impaired skin integrity will decrease Outcome: Progressing   Problem: Education: Goal: Knowledge of the prescribed therapeutic regimen Outcome: Progressing Goal: Knowledge of disease or condition will improve Outcome: Progressing   Problem: Clinical Measurements: Goal: Neurologic status will improve Outcome: Progressing   Problem: Tissue Perfusion: Goal: Ability to maintain intracranial pressure will improve Outcome: Progressing   Problem:  Respiratory: Goal: Will regain and/or maintain adequate ventilation Outcome: Progressing   Problem: Skin Integrity: Goal: Risk for impaired skin integrity will decrease Outcome: Progressing Goal: Demonstration of wound healing without infection will improve Outcome: Progressing   Problem: Psychosocial: Goal: Ability to verbalize positive feelings about self will improve Outcome: Progressing Goal: Ability to participate in self-care as condition permits will improve Outcome: Progressing Goal: Ability to identify appropriate support needs will improve Outcome: Progressing   Problem: Health Behavior/Discharge Planning: Goal: Ability to manage health-related needs will improve Outcome: Progressing   Problem: Nutritional: Goal: Risk of aspiration will decrease Outcome: Progressing Goal: Dietary intake will improve Outcome: Progressing   Problem: Communication: Goal: Ability to communicate needs accurately will improve Outcome: Progressing   

## 2020-11-07 ENCOUNTER — Other Ambulatory Visit: Payer: Self-pay

## 2020-11-07 NOTE — Progress Notes (Signed)
Pt cleared pre-employment drug screen. CL,RMA
